# Patient Record
Sex: Male | Born: 1990 | Race: White | Hispanic: No | Marital: Single | Smoking: Former smoker
Health system: Southern US, Community
[De-identification: ages and names within clinical notes are randomized; demographics above are authoritative.]

## PROBLEM LIST (undated history)

## (undated) DIAGNOSIS — F419 Anxiety disorder, unspecified: Secondary | ICD-10-CM

## (undated) HISTORY — DX: Anxiety disorder, unspecified: F41.9

---

## 2020-12-13 ENCOUNTER — Emergency Department (HOSPITAL_COMMUNITY): Payer: 59

## 2020-12-13 ENCOUNTER — Other Ambulatory Visit: Payer: Self-pay

## 2020-12-13 ENCOUNTER — Emergency Department (HOSPITAL_COMMUNITY)
Admission: EM | Admit: 2020-12-13 | Discharge: 2020-12-13 | Disposition: A | Discharge status: Home / Self Care | Payer: 59 | Attending: Emergency Medicine | Admitting: Emergency Medicine

## 2020-12-13 ENCOUNTER — Encounter (HOSPITAL_COMMUNITY): Payer: Self-pay

## 2020-12-13 DIAGNOSIS — R002 Palpitations: Secondary | ICD-10-CM | POA: Diagnosis not present

## 2020-12-13 DIAGNOSIS — R0789 Other chest pain: Secondary | ICD-10-CM | POA: Diagnosis not present

## 2020-12-13 LAB — BASIC METABOLIC PANEL
Anion gap: 9 (ref 5–15)
BUN: 9 mg/dL (ref 6–20)
CO2: 24 mmol/L (ref 22–32)
Calcium: 9.3 mg/dL (ref 8.9–10.3)
Chloride: 106 mmol/L (ref 98–111)
Creatinine, Ser: 0.9 mg/dL (ref 0.61–1.24)
GFR, Estimated: >60 mL/min (ref >60)
Glucose, Bld: 97 mg/dL (ref 70–99)
Potassium: 3.8 mmol/L (ref 3.5–5.1)
Sodium: 139 mmol/L (ref 135–145)

## 2020-12-13 LAB — CBC
HCT: 44.7 % (ref 39.0–52.0)
Hemoglobin: 15.2 g/dL (ref 13.0–17.0)
MCH: 29.6 pg (ref 26.0–34.0)
MCHC: 34 g/dL (ref 30.0–36.0)
MCV: 87 fL (ref 80.0–100.0)
Platelets: 248 10*3/uL (ref 150–400)
RBC: 5.14 MIL/uL (ref 4.22–5.81)
RDW: 12.5 % (ref 11.5–15.5)
WBC: 7.5 10*3/uL (ref 4.0–10.5)
nRBC: 0 % (ref 0.0–0.2)

## 2020-12-13 LAB — TROPONIN I (HIGH SENSITIVITY): Troponin I (High Sensitivity): <2 ng/L (ref <18)

## 2020-12-13 NOTE — ED Triage Notes (Signed)
Patient c/o chest discomfort x 2 weeks, especially when he lies down. Patient also reports that he had a one time experience 2 days ago where his heart was racing. Patient did report that he has at least one energy drink or 2 a day and caffeine sodas. Patient states he has not drank since having rapid heart rate. Patient denies any SOB, diaphoresis , or nausea with chest discomfort.

## 2020-12-13 NOTE — ED Provider Notes (Signed)
[facility]-EMERGENCY DEPT Provider Note   CSN: [id number] Arrival date & time: 12/13/20  1636     History Chief Complaint  Patient presents with  . Chest Pain  . Tachycardia    Howard Butler is a 30 y.o. male.  Howard Butler , a 30 y.o. male  was evaluated in triage.  Pt with no significant past medical history complains of left-sided chest discomfort for the past 2 to 3 weeks which occurs when he is lying supine on his left side, improves with laying on his back or on his right side.  Described as tightness feeling deep in his chest.  Denies chest pain with exertion.  Denies shortness of breath, diaphoresis.  States has been nauseous at times.  Patient states that the past 2 days he has woken up in the middle the night feeling like his heart is racing, lasts for a few minutes and resolves.  Patient states that he does drink a lot of caffeinated beverages throughout the day and has not been drinking much water lately.  Denies drug or alcohol use, is a non-smoker.        History reviewed. No pertinent past medical history.  There are no problems to display for this patient.   History reviewed. No pertinent surgical history.     Family History  Problem Relation Age of Onset  . Diabetes Mother     Social History   Tobacco Use  . Smoking status: Never Smoker  . Smokeless tobacco: Never Used  Vaping Use  . Vaping Use: Never used  Substance Use Topics  . Alcohol use: Never  . Drug use: Not Currently    Types: Marijuana    Home Medications Prior to Admission medications   Not on File    Allergies    Patient has no allergy information on record.  Review of Systems   Review of Systems  Constitutional: Negative for diaphoresis, fatigue and fever.  Respiratory: Negative for shortness of breath.   Cardiovascular: Positive for chest pain and palpitations.  Gastrointestinal: Positive for nausea. Negative for vomiting.   Musculoskeletal: Negative for arthralgias and myalgias.  Skin: Negative for rash.  Allergic/Immunologic: Negative for immunocompromised state.  Neurological: Negative for weakness.  All other systems reviewed and are negative.   Physical Exam Updated Vital Signs BP (!) 150/102 (BP Location: Left Arm)   Pulse 75   Temp 98.2 F (36.8 C) (Oral)   Resp 20   Ht 5\' 11"  (1.803 m)   Wt 93 kg   SpO2 100%   BMI 28.59 kg/m   Physical Exam Vitals and nursing note reviewed.  Constitutional:      General: He is not in acute distress.    Appearance: He is well-developed and well-nourished. He is not diaphoretic.  HENT:     Head: Normocephalic and atraumatic.  Cardiovascular:     Rate and Rhythm: Normal rate and regular rhythm.     Heart sounds: Normal heart sounds. No murmur heard.   Pulmonary:     Effort: Pulmonary effort is normal.     Breath sounds: Normal breath sounds.  Chest:     Chest wall: Tenderness present.       Comments: Mild ttp left chest wall, states this does not reproduce his pain.  Abdominal:     Palpations: Abdomen is soft.     Tenderness: There is no abdominal tenderness.  Musculoskeletal:     Cervical back: Neck supple.  Right lower leg: No edema.     Left lower leg: No edema.  Skin:    General: Skin is warm and dry.     Findings: No erythema or rash.  Neurological:     Mental Status: He is alert and oriented to person, place, and time.  Psychiatric:        Mood and Affect: Mood and affect normal.        Behavior: Behavior normal.     ED Results / Procedures / Treatments   Labs (all labs ordered are listed, but only abnormal results are displayed) Labs Reviewed  BASIC METABOLIC PANEL  CBC  TROPONIN I (HIGH SENSITIVITY)    EKG None  Radiology DG Chest 2 View  Result Date: 12/13/2020 CLINICAL DATA:  Chest pain EXAM: CHEST - 2 VIEW COMPARISON:  None. FINDINGS: The heart size and mediastinal contours are within normal limits. Both lungs  are clear. The visualized skeletal structures are unremarkable. IMPRESSION: Normal study. Electronically Signed   By: Charlett Nose M.D.   On: 12/13/2020 17:09    Procedures Procedures   Medications Ordered in ED Medications - No data to display  ED Course  I have reviewed the triage vital signs and the nursing notes.  Pertinent labs & imaging results that were available during my care of the patient were reviewed by me and considered in my medical decision making (see chart for details).  Clinical Course as of 12/13/20 1937  Howard Butler Dec 13, 2020  [id number] 30 year old male with complaint of chest pain and palpitations as above.  On exam found to have mild left chest wall tenderness which he states does not reproduce his discomfort.  EKG without acute ischemic changes, troponin is less than 2, doubt ACS.  CBC and BMP within normal limits, chest x-ray unremarkable. Discussed results with patient, recommend PCP follow-up, consider ambulatory monitoring.  Advised to return to the emergency room for worsening or concerning symptoms.  Recommend decrease caffeine intake, recommend ibuprofen 3 times daily for chest wall pain. [LM]    Clinical Course User Index [LM] [name]   MDM Rules/Calculators/A&P                         Final Clinical Impression(s) / ED Diagnoses Final diagnoses:  Palpitations  Atypical chest pain    Rx / DC Orders ED Discharge Orders    None       [name] 12/13/20 1937    Terald Sleeper, MD 12/14/20 [phone]

## 2020-12-13 NOTE — ED Triage Notes (Signed)
Emergency Medicine Provider Triage Evaluation Note  Howard Butler , a 30 y.o. male  was evaluated in triage.  Pt with no significant past medical history complains of left-sided chest discomfort for the past 2 to 3 weeks which occurs when he is lying supine on his left side, improves with laying on his back or on his right side.  Described as tightness feeling deep in his chest.  Denies chest pain with exertion.  Denies shortness of breath, diaphoresis.  States has been nauseous at times.  Patient states that the past 2 days he has woken up in the middle the night feeling like his heart is racing, lasts for a few minutes and resolves.  Patient states that he does drink a lot of caffeinated beverages throughout the day and has not been drinking much water lately.  Denies drug or alcohol use, is a non-smoker.  Review of Systems  Positive: Palpitations, chest pain Negative: Lower extremity edema, shortness of breath, diaphoresis  Physical Exam  BP (!) 150/102 (BP Location: Left Arm)   Pulse 75   Temp 98.2 F (36.8 C) (Oral)   Resp 20   Ht 5\' 11"  (1.803 m)   Wt 93 kg   SpO2 100%   BMI 28.59 kg/m  Gen:   Awake, no distress  HEENT:  Atraumatic Resp:  Normal effort, CTA Cardiac:  Normal rate, normal rhythm, no murmur Abd:   Nondistended, nontender  MSK:   Moves extremities without difficulty, no edema Neuro:  Speech clear   Medical Decision Making  Medically screening exam initiated at 5:00 PM.  Appropriate orders placed.  was informed that the remainder of the evaluation will be completed by another provider, this initial triage assessment does not replace that evaluation, and the importance of remaining in the ED until their evaluation is complete.  Clinical Impression     [name], PA-C 12/13/20 1702

## 2020-12-13 NOTE — Discharge Instructions (Addendum)
Limit caffeine intake and see how this affects her symptoms. Try taking 600 mg of ibuprofen 3 times daily with food, discontinue use if you develop abdominal pain. Follow-up with a primary care provider, referral given for [facility] if you do not have a primary care provider. Return to the ER for worsening or concerning symptoms.

## 2021-01-06 ENCOUNTER — Emergency Department (HOSPITAL_COMMUNITY)
Admission: EM | Admit: 2021-01-06 | Discharge: 2021-01-06 | Disposition: A | Discharge status: Home / Self Care | Payer: 59 | Attending: Emergency Medicine | Admitting: Emergency Medicine

## 2021-01-06 ENCOUNTER — Other Ambulatory Visit: Payer: Self-pay

## 2021-01-06 ENCOUNTER — Encounter (HOSPITAL_COMMUNITY): Payer: Self-pay | Admitting: Emergency Medicine

## 2021-01-06 ENCOUNTER — Ambulatory Visit: Payer: Self-pay | Admitting: *Deleted

## 2021-01-06 DIAGNOSIS — R0602 Shortness of breath: Secondary | ICD-10-CM | POA: Diagnosis not present

## 2021-01-06 DIAGNOSIS — R002 Palpitations: Secondary | ICD-10-CM | POA: Insufficient documentation

## 2021-01-06 DIAGNOSIS — M549 Dorsalgia, unspecified: Secondary | ICD-10-CM | POA: Insufficient documentation

## 2021-01-06 DIAGNOSIS — Z5321 Procedure and treatment not carried out due to patient leaving prior to being seen by health care provider: Secondary | ICD-10-CM | POA: Insufficient documentation

## 2021-01-06 NOTE — Telephone Encounter (Signed)
Patient c/o heart palpitations and increased SOB throughout the day. Now feeling labored breathing and lightheadedness. reports he feels he needs to keep moving to keep heart rate in the 90's. Reports when bending over heart rate goes to 170's. When lying down heart rate goes to 40's-60's. Patient report apple watch activity noted with record of irregular heart rates. Patient is scheduled  To see a cardiologist on Thursday, but palpitations are worse today.  Reports taking 1000 mg acetaminophen every 4 to 6 hours for several days. Instructed patient not to take greater than 4000 mg of acetaminophen in a 24 hour period. Patient reports he was told he could take aspirin but was taking acetaminophen . Patient instructed to go to ED now and patient reports he will eat and see if he continues to have labored breathing and irregular heart rate. Care advise given. Patient verbalized understanding of care advise and to go to ED or call 911 if symptoms worsen.   Reason for Disposition . [1] Skipped or extra beat(s) AND [2] increases with exercise or exertion  Answer Assessment - Initial Assessment Questions 1. DESCRIPTION: "Please describe your heart rate or heartbeat that you are having" (e.g., fast/slow, regular/irregular, skipped or extra beats, "palpitations")     Slow heart rate, irregular, skipped beats,  2. ONSET: "When did it start?" (Minutes, hours or days)     Several  Days and today is worst  3. DURATION: "How long does it last" (e.g., seconds, minutes, hours)     Can go from 40's-60's to 90's and 170's if bending over  4. PATTERN "Does it come and go, or has it been constant since it started?"  "Does it get worse with exertion?"   "Are you feeling it now?"     constant 5. TAP: "Using your hand, can you tap out what you are feeling on a chair or table in front of you, so that I can hear?" (Note: not all patients can do this)       na 6. HEART RATE: "Can you tell me your heart rate?" "How many beats  in 15 seconds?"  (Note: not all patients can do this)       86 bpm and goes to 91bpm 7. RECURRENT SYMPTOM: "Have you ever had this before?" If Yes, ask: "When was the last time?" and "What happened that time?"      Yes , a few weeks ago and was sent home from ED 8. CAUSE: "What do you think is causing the palpitations?"     Not sure 9. CARDIAC HISTORY: "Do you have any history of heart disease?" (e.g., heart attack, angina, bypass surgery, angioplasty, arrhythmia)      unknown 10. OTHER SYMPTOMS: "Do you have any other symptoms?" (e.g., dizziness, chest pain, sweating, difficulty breathing)        Labored breathing, lightheadedness 11. PREGNANCY: "Is there any chance you are pregnant?" "When was your last menstrual period?"       na  Protocols used: HEART RATE AND HEARTBEAT QUESTIONS-A-AH

## 2021-01-06 NOTE — ED Triage Notes (Signed)
Patient presents with palpitations, SOB and back muscle tightness. The patient has been previously seen for this about 1 month ago and was referred to a specialist. He has an appointment with a cardiologist on Thursday.  He also states he is concerned about the dramatic fluctuations he has seen in his heart rate. He believes the cause of this may be due to his Covid vaccine.

## 2021-01-06 NOTE — ED Notes (Signed)
Pt stated that his heart rate was between 80 and 90 bpm as checked on his smart watch. He felt better and decided to leave without being seen after triage.

## 2021-01-06 NOTE — ED Notes (Signed)
Called 3x for room placement. Eloped from waiting area.  

## 2021-01-07 ENCOUNTER — Encounter (HOSPITAL_COMMUNITY): Payer: Self-pay | Admitting: *Deleted

## 2021-01-07 ENCOUNTER — Emergency Department (HOSPITAL_COMMUNITY): Payer: 59

## 2021-01-07 ENCOUNTER — Emergency Department (HOSPITAL_COMMUNITY)
Admission: EM | Admit: 2021-01-07 | Discharge: 2021-01-07 | Disposition: A | Discharge status: Home / Self Care | Payer: 59 | Attending: Emergency Medicine | Admitting: Emergency Medicine

## 2021-01-07 DIAGNOSIS — R0602 Shortness of breath: Secondary | ICD-10-CM | POA: Diagnosis not present

## 2021-01-07 DIAGNOSIS — R002 Palpitations: Secondary | ICD-10-CM | POA: Insufficient documentation

## 2021-01-07 DIAGNOSIS — R945 Abnormal results of liver function studies: Secondary | ICD-10-CM | POA: Diagnosis not present

## 2021-01-07 DIAGNOSIS — R0789 Other chest pain: Secondary | ICD-10-CM | POA: Insufficient documentation

## 2021-01-07 DIAGNOSIS — R7989 Other specified abnormal findings of blood chemistry: Secondary | ICD-10-CM

## 2021-01-07 DIAGNOSIS — M545 Low back pain, unspecified: Secondary | ICD-10-CM | POA: Insufficient documentation

## 2021-01-07 LAB — COMPREHENSIVE METABOLIC PANEL
ALT: 103 U/L — ABNORMAL HIGH (ref 0–44)
AST: 53 U/L — ABNORMAL HIGH (ref 15–41)
Albumin: 5 g/dL (ref 3.5–5.0)
Alkaline Phosphatase: 66 U/L (ref 38–126)
Anion gap: 14 (ref 5–15)
BUN: 14 mg/dL (ref 6–20)
CO2: 20 mmol/L — ABNORMAL LOW (ref 22–32)
Calcium: 10.2 mg/dL (ref 8.9–10.3)
Chloride: 104 mmol/L (ref 98–111)
Creatinine, Ser: 0.9 mg/dL (ref 0.61–1.24)
GFR, Estimated: >60 mL/min (ref >60)
Glucose, Bld: 106 mg/dL — ABNORMAL HIGH (ref 70–99)
Potassium: 3.6 mmol/L (ref 3.5–5.1)
Sodium: 138 mmol/L (ref 135–145)
Total Bilirubin: 1.4 mg/dL — ABNORMAL HIGH (ref 0.3–1.2)
Total Protein: 9 g/dL — ABNORMAL HIGH (ref 6.5–8.1)

## 2021-01-07 LAB — CBC WITH DIFFERENTIAL/PLATELET
Abs Immature Granulocytes: 0.01 10*3/uL (ref 0.00–0.07)
Basophils Absolute: 0 10*3/uL (ref 0.0–0.1)
Basophils Relative: 1 %
Eosinophils Absolute: 0 10*3/uL (ref 0.0–0.5)
Eosinophils Relative: 0 %
HCT: 46.2 % (ref 39.0–52.0)
Hemoglobin: 16.6 g/dL (ref 13.0–17.0)
Immature Granulocytes: 0 %
Lymphocytes Relative: 35 %
Lymphs Abs: 1.7 10*3/uL (ref 0.7–4.0)
MCH: 29.5 pg (ref 26.0–34.0)
MCHC: 35.9 g/dL (ref 30.0–36.0)
MCV: 82.1 fL (ref 80.0–100.0)
Monocytes Absolute: 0.6 10*3/uL (ref 0.1–1.0)
Monocytes Relative: 12 %
Neutro Abs: 2.6 10*3/uL (ref 1.7–7.7)
Neutrophils Relative %: 52 %
Platelets: 232 10*3/uL (ref 150–400)
RBC: 5.63 MIL/uL (ref 4.22–5.81)
RDW: 12.2 % (ref 11.5–15.5)
WBC: 5 10*3/uL (ref 4.0–10.5)
nRBC: 0 % (ref 0.0–0.2)

## 2021-01-07 LAB — TSH: TSH: 1.871 u[IU]/mL (ref 0.350–4.500)

## 2021-01-07 LAB — TROPONIN I (HIGH SENSITIVITY): Troponin I (High Sensitivity): 3 ng/L (ref <18)

## 2021-01-07 LAB — D-DIMER, QUANTITATIVE: D-Dimer, Quant: 0.43 ug/mL-FEU (ref 0.00–0.50)

## 2021-01-07 NOTE — Discharge Instructions (Signed)
As discussed, all of your labs are reassuring today.  Your cardiac marker was normal.  Your chest x-ray and EKG were also normal.  Report to your cardiology appointment on Thursday for further evaluation.  I recommend refraining from Tylenol use because your liver enzymes were elevated today. Have labs rechecked in a week to ensure liver enzymes return to normal. Return to the ER for new or worsening symptoms.

## 2021-01-07 NOTE — ED Provider Notes (Signed)
[facility]-EMERGENCY DEPT Provider Note   CSN: [id number] Arrival date & time: 01/07/21  0825     History Chief Complaint  Patient presents with  . Tachycardia    Howard Butler is a 30 y.o. male with no significant past medical history who presents to the ED due to palpitations associated with shortness of breath and chest/back pain.  Patient describes pain as a discomfort which he relates to a "pulled muscle".  Discomfort started mid December into January which started on the left side of his chest.  Patient states pain was worse when sleeping on his left side.  He attributed the pain to a recent move to the area from [place] after lifting heavy boxes, patient notes discomfort began to move to his upper back.  He has been taking 3 g of Tylenol daily with moderate relief.  Patient also admits to intermittent episodes of palpitations associated with exertion.  Patient states when he walks short distances his heart rate will jump to the 120s to 130s per his apple watch and occasionally to 170s.  Patient notes his heart rate normalizes after roughly 1 minute of rest.  Palpitations associated with shortness of breath.  Denies central chest pressure with episodes of palpitations.  Patient notes he spoke to a doctor a few weeks ago who had concerns about possible myocarditis following the Moderna vaccine.  Patient denies recent illness.  Denies fever and chills.  Denies lower extremity edema.  Denies history of blood clots, recent surgeries or recent long immobilizations, and hormonal treatments.  Patient's longest travel was roughly 9 to 10 hours driving from [place] to [place] over a month ago which he notes he had frequent stops.  Chart reviewed.  Patient was evaluated in the ED on 12/13/2020 for the same complaint with normal troponin and reassuring work-up.  Patient has a cardiology appointment on Thursday for further evaluation of palpitations.  Patient notes he was  drinking roughly 1-2 energy drinks prior to his first evaluation in the ED on the 28th however, has stopped drinking caffeine with an occasional Sprite.  Denies amphetamines and cocaine use.  Denies any cardiac history.  No family history of early CAD.  Denies tobacco use. Denies alcohol use.   History obtained from patient and past medical records. No interpreter used during encounter.      History reviewed. No pertinent past medical history.  There are no problems to display for this patient.   History reviewed. No pertinent surgical history.     Family History  Problem Relation Age of Onset  . Diabetes Mother     Social History   Tobacco Use  . Smoking status: Never Smoker  . Smokeless tobacco: Never Used  Vaping Use  . Vaping Use: Never used  Substance Use Topics  . Alcohol use: Never  . Drug use: Not Currently    Types: Marijuana    Home Medications Prior to Admission medications   Not on File    Allergies    Patient has no known allergies.  Review of Systems   Review of Systems  Constitutional: Negative for chills and fever.  Respiratory: Positive for shortness of breath. Negative for cough.   Cardiovascular: Positive for chest pain and palpitations. Negative for leg swelling.  Gastrointestinal: Negative for abdominal pain.  All other systems reviewed and are negative.   Physical Exam Updated Vital Signs BP 121/81   Pulse 82   Temp 98 F (36.7 C) (Oral)   Resp  17   Ht 5\' 11"  (1.803 m)   Wt 93 kg   SpO2 99%   BMI 28.59 kg/m   Physical Exam Vitals and nursing note reviewed.  Constitutional:      General: He is not in acute distress.    Appearance: He is not toxic-appearing.  HENT:     Head: Normocephalic.  Eyes:     Pupils: Pupils are equal, round, and reactive to light.  Cardiovascular:     Rate and Rhythm: Normal rate and regular rhythm.     Pulses: Normal pulses.     Heart sounds: Normal heart sounds. No murmur heard. No friction  rub. No gallop.   Pulmonary:     Effort: Pulmonary effort is normal.     Breath sounds: Normal breath sounds.  Chest:     Comments: Tenderness palpation left-sided anterior chest wall.  No crepitus or deformity. Abdominal:     General: Abdomen is flat. Bowel sounds are normal. There is no distension.     Palpations: Abdomen is soft.     Tenderness: There is no abdominal tenderness. There is no guarding or rebound.  Musculoskeletal:     Cervical back: Neck supple.     Comments: No lower extremity edema.  Negative Homans' sign bilaterally.  Skin:    General: Skin is warm and dry.  Neurological:     General: No focal deficit present.     Mental Status: He is alert.  Psychiatric:        Mood and Affect: Mood is anxious.        Behavior: Behavior normal.     ED Results / Procedures / Treatments   Labs (all labs ordered are listed, but only abnormal results are displayed) Labs Reviewed  COMPREHENSIVE METABOLIC PANEL - Abnormal; Notable for the following components:      Result Value   CO2 20 (*)    Glucose, Bld 106 (*)    Total Protein 9.0 (*)    AST 53 (*)    ALT 103 (*)    Total Bilirubin 1.4 (*)    All other components within normal limits  CBC WITH DIFFERENTIAL/PLATELET  D-DIMER, QUANTITATIVE  TSH  TROPONIN I (HIGH SENSITIVITY)    EKG None  Radiology DG Chest Portable 1 View  Result Date: 01/07/2021 CLINICAL DATA:  Chest pain and shortness of breath EXAM: PORTABLE CHEST 1 VIEW COMPARISON:  December 13, 2020. FINDINGS: Lungs are clear. Heart size and pulmonary vascularity are normal. No adenopathy. No pneumothorax. No bone lesions. IMPRESSION: Lungs clear.  Cardiac silhouette normal. Electronically Signed   By: December 15, 2020 III M.D.   On: 01/07/2021 10:51    Procedures Procedures   Medications Ordered in ED Medications - No data to display  ED Course  I have reviewed the triage vital signs and the nursing notes.  Pertinent labs & imaging results that were  available during my care of the patient were reviewed by me and considered in my medical decision making (see chart for details).    MDM Rules/Calculators/A&P                         30 year old male presents to the ED due to palpitations associated with left-sided chest and back discomfort that has been ongoing for the past 1 to 2 months.  Patient has history of blood clots, recent surgeries, recent long immobilizations, hormonal treatment.  Patient concerned about possible myocarditis following the Moderna vaccine. Denies fever  and chills.  He has a cardiology appointment on Thursday for further evaluation of palpitations.  Upon arrival, patient afebrile, tachycardic at 110 with mild tachypnea at 21.  Patient maintaining O2 saturation above 95% on room air.  During initial evaluation, patient appears anxious at bedside.  No clinical signs of DVT.  Reproducible left-sided anterior chest wall tenderness.  No crepitus or deformity.  EKG, chest x-ray, and troponin ordered to rule out cardiac etiology.  D-dimer to rule out PE given history of travel from [place] with tachycardia.  Routine labs.  Suspect tachycardia could partially be related to underlying anxiety.  Patient denies alcohol use.  Low suspicion for alcohol withdrawal.  CBC unremarkable no leukocytosis and normal hemoglobin.  TSH normal. D-dimer normal at 0.43.  Doubt PE/DVT.  Troponin normal at 3.  Low suspicion for ACS.  Dissection was considered but thought to be less likely given presentation and longevity of symptoms.  CMP significant for elevated LFTs with AST at 53 and ALT at 103.  Will have patient follow-up with PCP to repeat LFTs to ensure downtrending.  Chest x-ray personally reviewed which is negative for signs of pneumonia, pneumothorax, widened mediastinum.  EKG personally reviewed which demonstrates normal sinus rhythm with early repolarization. Encouraged patient to limit tylenol use due to LFTs.  Advised patient report to his  cardiology appointment on Thursday for further evaluation of palpitations. Strict ED precautions discussed with patient. Patient states understanding and agrees to plan. Patient discharged home in no acute distress and stable vitals.  Final Clinical Impression(s) / ED Diagnoses Final diagnoses:  Palpitations  Elevated LFTs    Rx / DC Orders ED Discharge Orders    None       Jesusita Oka 01/07/21 1207    Jacalyn Lefevre, MD 01/07/21 1446

## 2021-01-07 NOTE — ED Triage Notes (Signed)
Pt reports rapid HR over the last month. States if he moves it goes over a hundred. Pt 106 in triage while talking nonstop.

## 2021-01-09 ENCOUNTER — Ambulatory Visit: Payer: 59 | Admitting: Cardiology

## 2021-01-09 ENCOUNTER — Other Ambulatory Visit: Payer: Self-pay

## 2021-01-09 ENCOUNTER — Encounter: Payer: Self-pay | Admitting: Cardiology

## 2021-01-09 ENCOUNTER — Inpatient Hospital Stay: Payer: 59

## 2021-01-09 VITALS — BP 144/99 | HR 86 | Temp 97.8°F | Resp 18 | Ht 72.0 in | Wt 200.0 lb

## 2021-01-09 DIAGNOSIS — R002 Palpitations: Secondary | ICD-10-CM

## 2021-01-09 DIAGNOSIS — I498 Other specified cardiac arrhythmias: Secondary | ICD-10-CM

## 2021-01-09 DIAGNOSIS — G90A Postural orthostatic tachycardia syndrome (POTS): Secondary | ICD-10-CM

## 2021-01-09 DIAGNOSIS — I4510 Unspecified right bundle-branch block: Secondary | ICD-10-CM

## 2021-01-09 NOTE — Progress Notes (Unsigned)
    Patient referred by No ref. provider found for palpitations  Subjective:   Howard Butler, male    DOB: 1991/06/08, 30 y.o.   MRN: [id number]   Chief Complaint  Patient presents with  . Palpitations  . New Patient (Initial Visit)     HPI  30 y.o. Caucasian male with tachycardia  Patient moved from [place] in 2021, works as a Neurosurgeon. Since 09/2020, he has experienced tachycardia with minimal physical activity. He denies any particular chest pain or shortness of breath. Heart rate increased on standing up. He was been seen in ER twice, but work has been essentially unremarkable.   He was previously a light smoker, occasionally used ecstasy, recently reduced marijuana use.    Past Medical History:  Diagnosis Date  . Anxiety      History reviewed. No pertinent surgical history.   Social History   Tobacco Use  Smoking Status Former Smoker  . Packs/day: 0.25  . Years: 2.00  . Pack years: 0.50  . Types: Cigarettes  . Quit date: 2021  . Years since quitting: 1.1  Smokeless Tobacco Never Used    Social History   Substance and Sexual Activity  Alcohol Use Never     Family History  Problem Relation Age of Onset  . Diabetes Mother      No current outpatient medications on file prior to visit.   No current facility-administered medications on file prior to visit.    Cardiovascular and other pertinent studies:  EKG 01/09/2021: Sinus rhythm 87 bpm  Incomplete right bundle branch block   Recent labs: 01/07/2021: Glucose 106, BUN/Cr 14/0.9. EGFR >60. Na/K 138/3.6. AST/ALT 53/103. Total protein 9. T.bili 1.4.  Rest of the CMP normal H/H 16/46. MCV 82. Platelets 232 TSH 1.8 normal    Review of Systems  Cardiovascular: Positive for palpitations. Negative for chest pain, dyspnea on exertion, leg swelling and syncope.         Vitals:   01/09/21 0933 01/09/21 0940  BP: (!) 146/98 (!) 144/99  Pulse: 91 86  Resp: 18   Temp: 97.8  F (36.6 C)   SpO2: 96%      Body mass index is 27.12 kg/m. Filed Weights   01/09/21 0933  Weight: 200 lb (90.7 kg)     Objective:   Physical Exam Vitals and nursing note reviewed.  Constitutional:      General: He is not in acute distress. Neck:     Vascular: No JVD.  Cardiovascular:     Rate and Rhythm: Normal rate and regular rhythm.     Heart sounds: Normal heart sounds. No murmur heard.   Pulmonary:     Effort: Pulmonary effort is normal.     Breath sounds: Normal breath sounds. No wheezing or rales.          Assessment & Recommendations:   30 y.o. Caucasian male with tachycardia  Tachycardia: Symptoms and orthostatic vitals s/p POTS Encouraged liberal hydration. Consider compression stockings.  Will check serum and urine metanephrines to exclude secondary cause.  Incomplete RBBB: Will check echcoardiogram     Thank you for referring the patient to [place]. Please feel free to contact with any questions.   Nigel Mormon, MD Pager: [phone] Office: [phone]

## 2021-01-10 ENCOUNTER — Encounter: Payer: Self-pay | Admitting: Cardiology

## 2021-01-10 DIAGNOSIS — R002 Palpitations: Secondary | ICD-10-CM | POA: Insufficient documentation

## 2021-01-10 DIAGNOSIS — G90A Postural orthostatic tachycardia syndrome (POTS): Secondary | ICD-10-CM | POA: Insufficient documentation

## 2021-01-10 DIAGNOSIS — I498 Other specified cardiac arrhythmias: Secondary | ICD-10-CM | POA: Insufficient documentation

## 2021-01-10 DIAGNOSIS — I4510 Unspecified right bundle-branch block: Secondary | ICD-10-CM | POA: Insufficient documentation

## 2021-01-16 ENCOUNTER — Other Ambulatory Visit: Payer: 59

## 2021-01-28 ENCOUNTER — Other Ambulatory Visit: Payer: Self-pay

## 2021-01-28 ENCOUNTER — Ambulatory Visit: Payer: 59

## 2021-01-28 DIAGNOSIS — I4510 Unspecified right bundle-branch block: Secondary | ICD-10-CM

## 2021-01-28 DIAGNOSIS — I498 Other specified cardiac arrhythmias: Secondary | ICD-10-CM

## 2021-01-28 DIAGNOSIS — R002 Palpitations: Secondary | ICD-10-CM

## 2021-01-28 DIAGNOSIS — G90A Postural orthostatic tachycardia syndrome (POTS): Secondary | ICD-10-CM

## 2021-02-10 ENCOUNTER — Other Ambulatory Visit: Payer: Self-pay

## 2021-02-10 ENCOUNTER — Ambulatory Visit: Payer: 59 | Admitting: Cardiology

## 2021-02-10 ENCOUNTER — Encounter: Payer: Self-pay | Admitting: Cardiology

## 2021-02-10 VITALS — BP 128/89 | HR 95 | Temp 97.8°F | Resp 17 | Ht 72.0 in | Wt 206.0 lb

## 2021-02-10 DIAGNOSIS — G90A Postural orthostatic tachycardia syndrome (POTS): Secondary | ICD-10-CM

## 2021-02-10 DIAGNOSIS — I498 Other specified cardiac arrhythmias: Secondary | ICD-10-CM

## 2021-02-10 DIAGNOSIS — R000 Tachycardia, unspecified: Secondary | ICD-10-CM | POA: Insufficient documentation

## 2021-02-10 NOTE — Progress Notes (Signed)
Patient referred by No ref. provider found for palpitations  Subjective:   Howard Butler, male    DOB: Mar 20, 1991, 30 y.o.   MRN: [id number]   Chief Complaint  Patient presents with  . Palpitations  . Follow-up    4 week  . Results    Echo and monitor  . pots     HPI  30 y.o. Caucasian male with POTS  Patient is feeling better than his initial visit.  He has been using compression stockings diligently.  He has also been eating small meals.  Heart racing sensation and lightheadedness on standing up and walking has improved.  Today, he endorses having had a large meal with a sandwich.  Blood pressure elevated today.  Initial consultation HPI 12/2020: Patient moved from [place] in 2021, works as a Neurosurgeon. Since 09/2020, he has experienced tachycardia with minimal physical activity. He denies any particular chest pain or shortness of breath. Heart rate increased on standing up. He was been seen in ER twice, but work has been essentially unremarkable.   He was previously a light smoker, occasionally used ecstasy, recently reduced marijuana use.     Cardiovascular and other pertinent studies:  Echocardiogram 01/28/2021:  Left ventricle cavity is normal in size and wall thickness. Normal global  wall motion. Normal LV systolic function with EF 66%. Normal diastolic  filling pattern.  No significant valvular abnormality.  Normal right atrial pressure.   Mobile cardiac telemetry 6 days 01/09/2021 - 01/16/2021: Dominant rhythm: Sinus. HR 38-151 bpm. Avg HR 70 bpm. <1% isolated SVE, no couplet/triplets. <1% isolated VE, no couplet/triplets. No atrial fibrillation/atrial flutter/SVT/VT/high grade AV block, sinus pause >3sec noted. 1 patient triggered events, correlated with sinus rhythm   EKG 01/09/2021: Sinus rhythm 87 bpm  Incomplete right bundle branch block   Recent labs: 01/07/2021: Glucose 106, BUN/Cr 14/0.9. EGFR >60. Na/K 138/3.6. AST/ALT 53/103.  Total protein 9. T.bili 1.4.  Rest of the CMP normal H/H 16/46. MCV 82. Platelets 232 TSH 1.8 normal    Review of Systems  Cardiovascular: Positive for palpitations. Negative for chest pain, dyspnea on exertion, leg swelling and syncope.         Vitals:   02/10/21 1319 02/10/21 1321  BP: (!) 196/96 128/89  Pulse: (!) 104 95  Resp: 17   Temp: 97.8 F (36.6 C)   SpO2: 97%     Orthostatic VS for the past 72 hrs (Last 3 readings):  Orthostatic BP Patient Position BP Location Cuff Size Orthostatic Pulse  02/10/21 1332 (!) 152/100 Standing Left Arm Normal 121  02/10/21 1331 132/88 Sitting Left Arm Normal 99  02/10/21 1330 135/89 Supine Left Arm Normal 86      Body mass index is 27.94 kg/m. Filed Weights   02/10/21 1319  Weight: 206 lb (93.4 kg)     Objective:   Physical Exam Vitals and nursing note reviewed.  Constitutional:      General: He is not in acute distress. Neck:     Vascular: No JVD.  Cardiovascular:     Rate and Rhythm: Normal rate and regular rhythm.     Heart sounds: Normal heart sounds. No murmur heard.   Pulmonary:     Effort: Pulmonary effort is normal.     Breath sounds: Normal breath sounds. No wheezing or rales.          Assessment & Recommendations:   30 y.o. Caucasian male with POTS  POTS: He still has increasing heart rate on  standing up, but symptomatically is better.  Continue wearing compression stockings, eating small meals.  Contrary to to initial recommendation, I have not asked him to reduce salt intake as his blood pressure is elevated.  I will obtain exercise 7 stress test to assess his heart rate response to exercise.  Recommend follow-up with PCP at some point for elevated liver function enzymes.  F/u in 6 months.    Nigel Mormon, MD Pager: [phone] Office: [phone]

## 2021-08-13 ENCOUNTER — Ambulatory Visit: Payer: 59 | Admitting: Cardiology

## 2022-03-09 IMAGING — CR DG CHEST 2V
2 series · 2 of 2 positions shown · non-contrast
Comparison: None.

CLINICAL DATA: Chest pain

EXAM:
CHEST - 2 VIEW

[w chest pa]
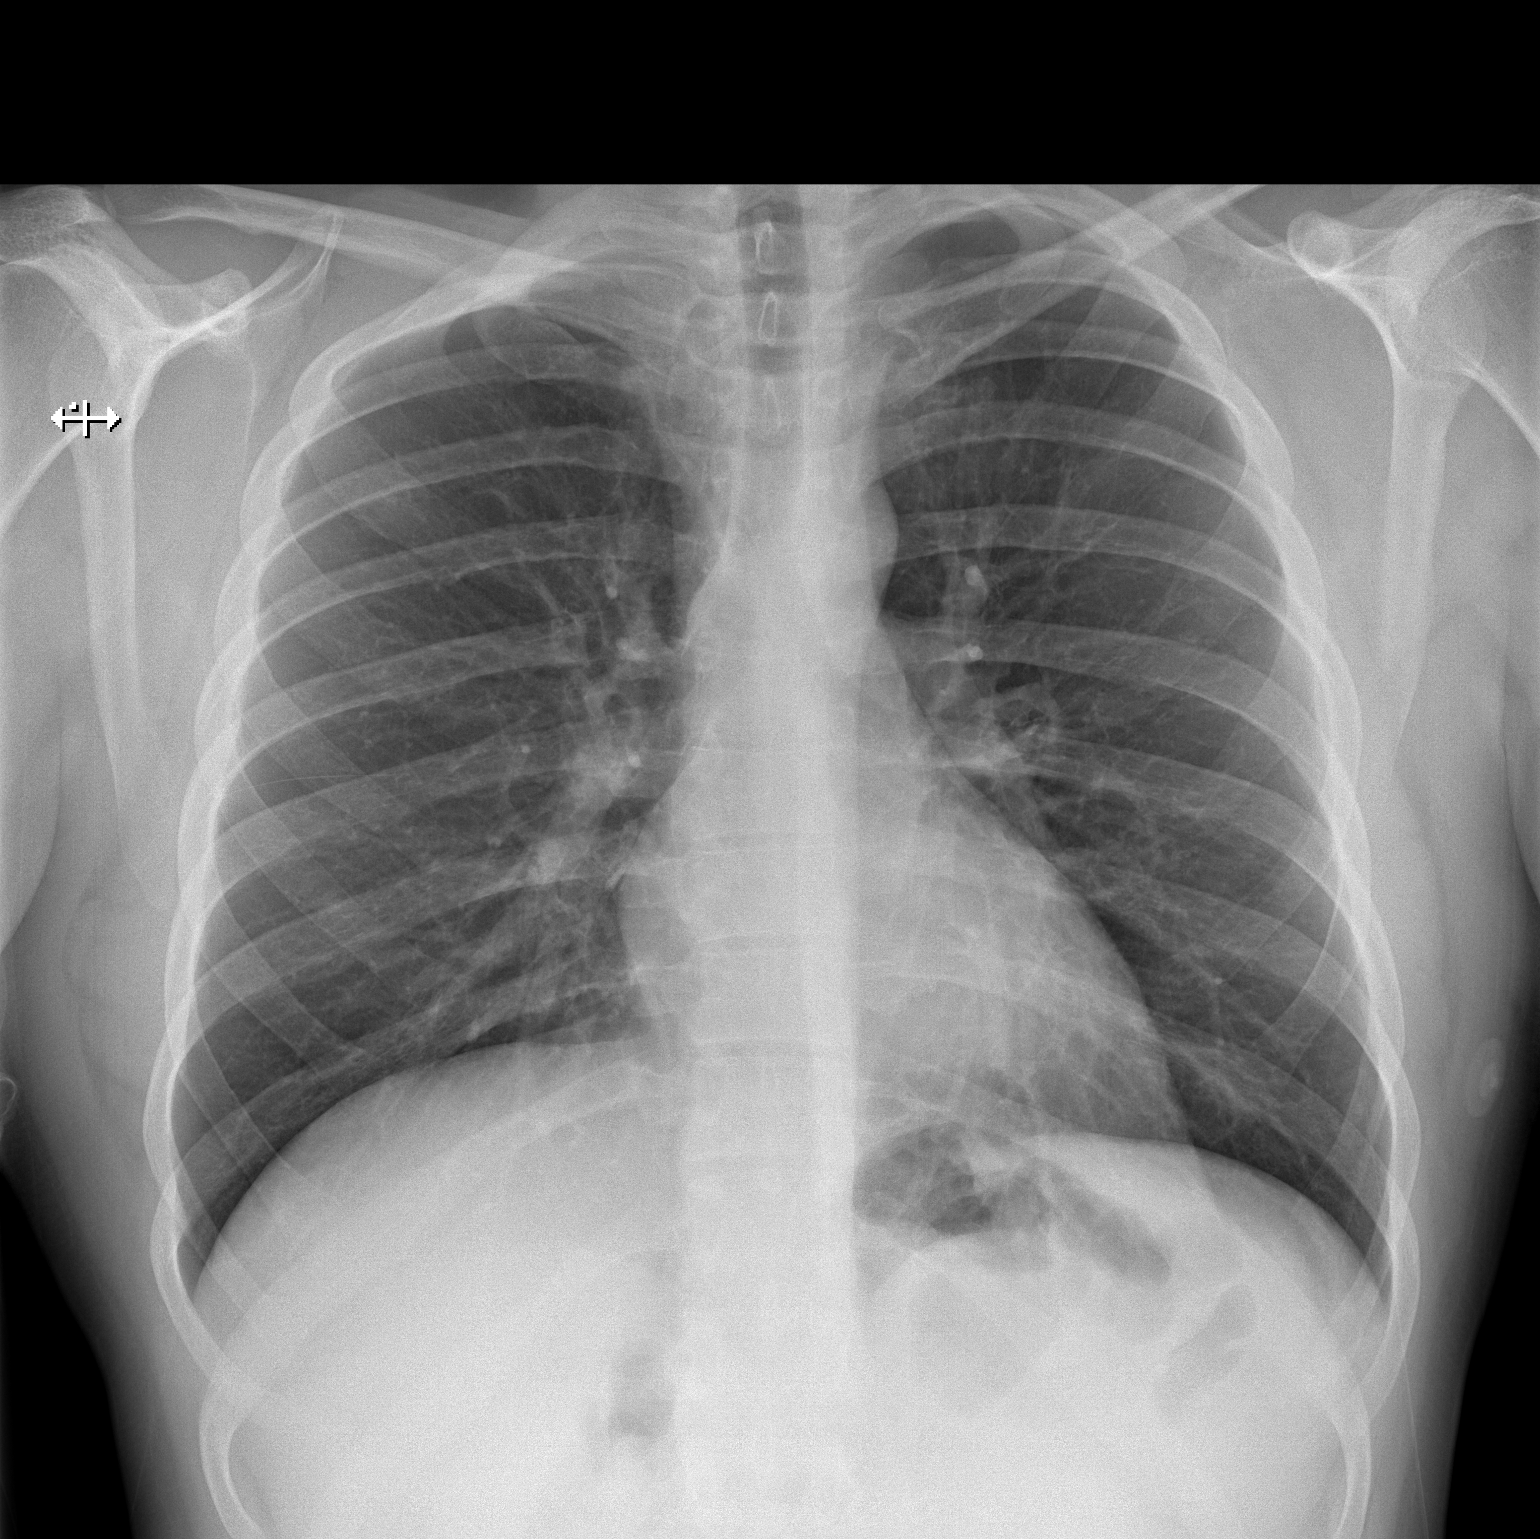

[w chest lat]
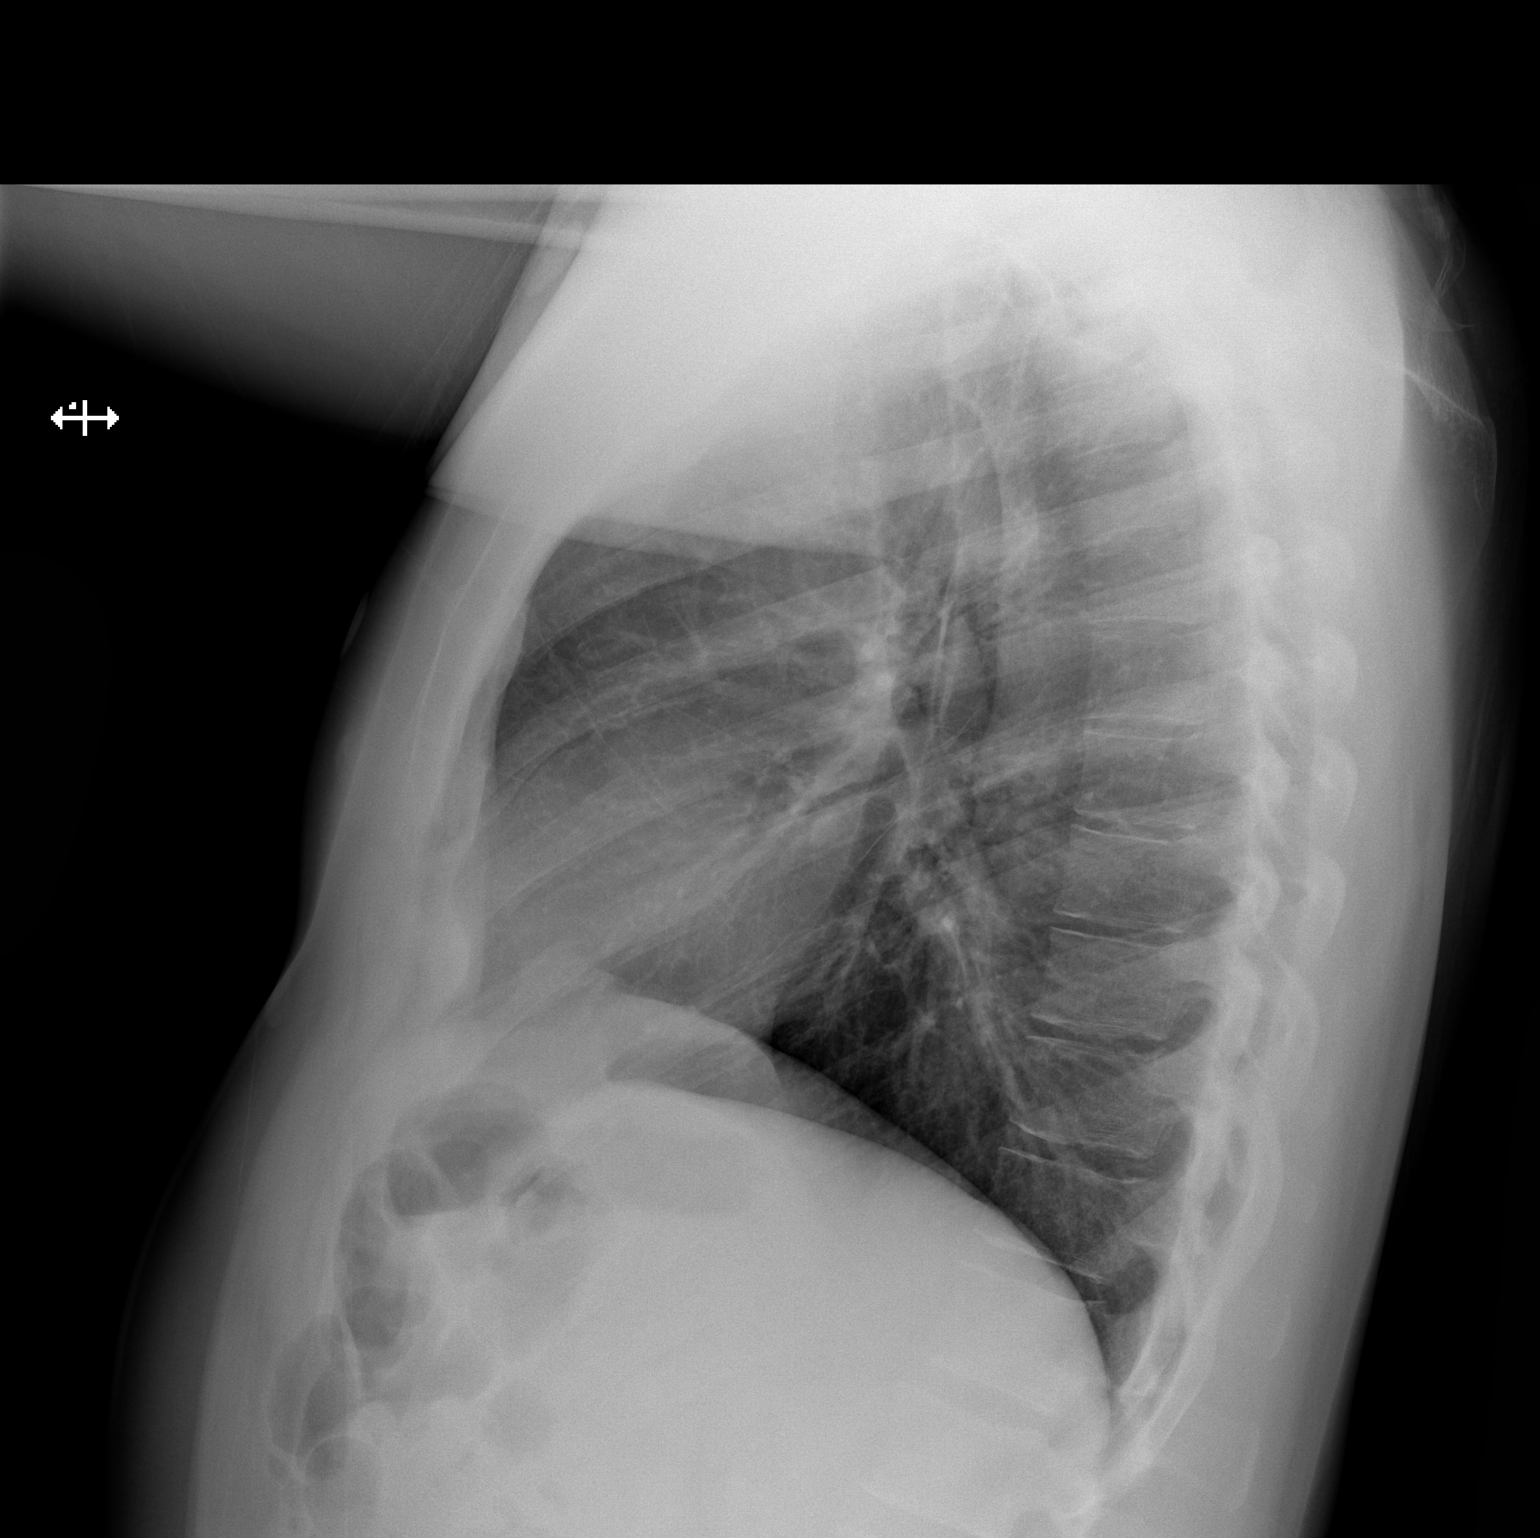

[2 of 2 positions shown; findings below may reference images not displayed]

FINDINGS: The heart size and mediastinal contours are within normal limits.
Both lungs are clear. The visualized skeletal structures are
unremarkable.
IMPRESSION: Normal study.

## 2022-04-03 IMAGING — DX DG CHEST 1V PORT
1 series · 2 of 2 positions shown · non-contrast
Comparison: December 13, 2020.

CLINICAL DATA: Chest pain and shortness of breath

EXAM:
PORTABLE CHEST 1 VIEW

[Series 1: chest ap · 0.14mm/px · 2 of 2 slices shown]
[im 1/2]
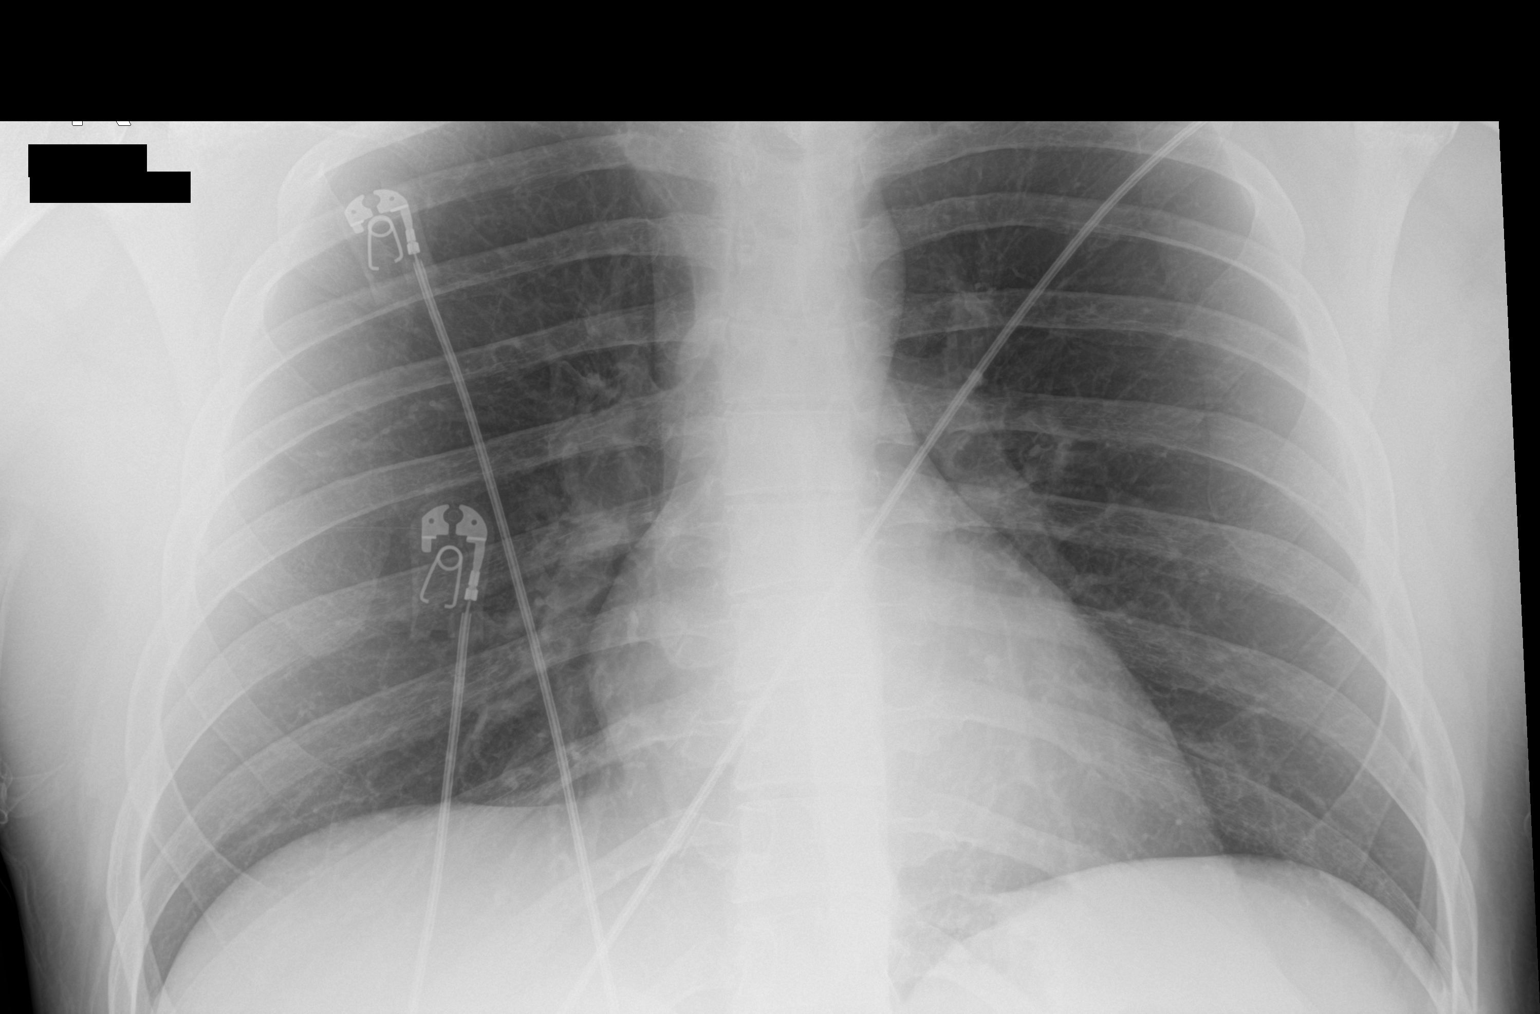
[im 2/2]
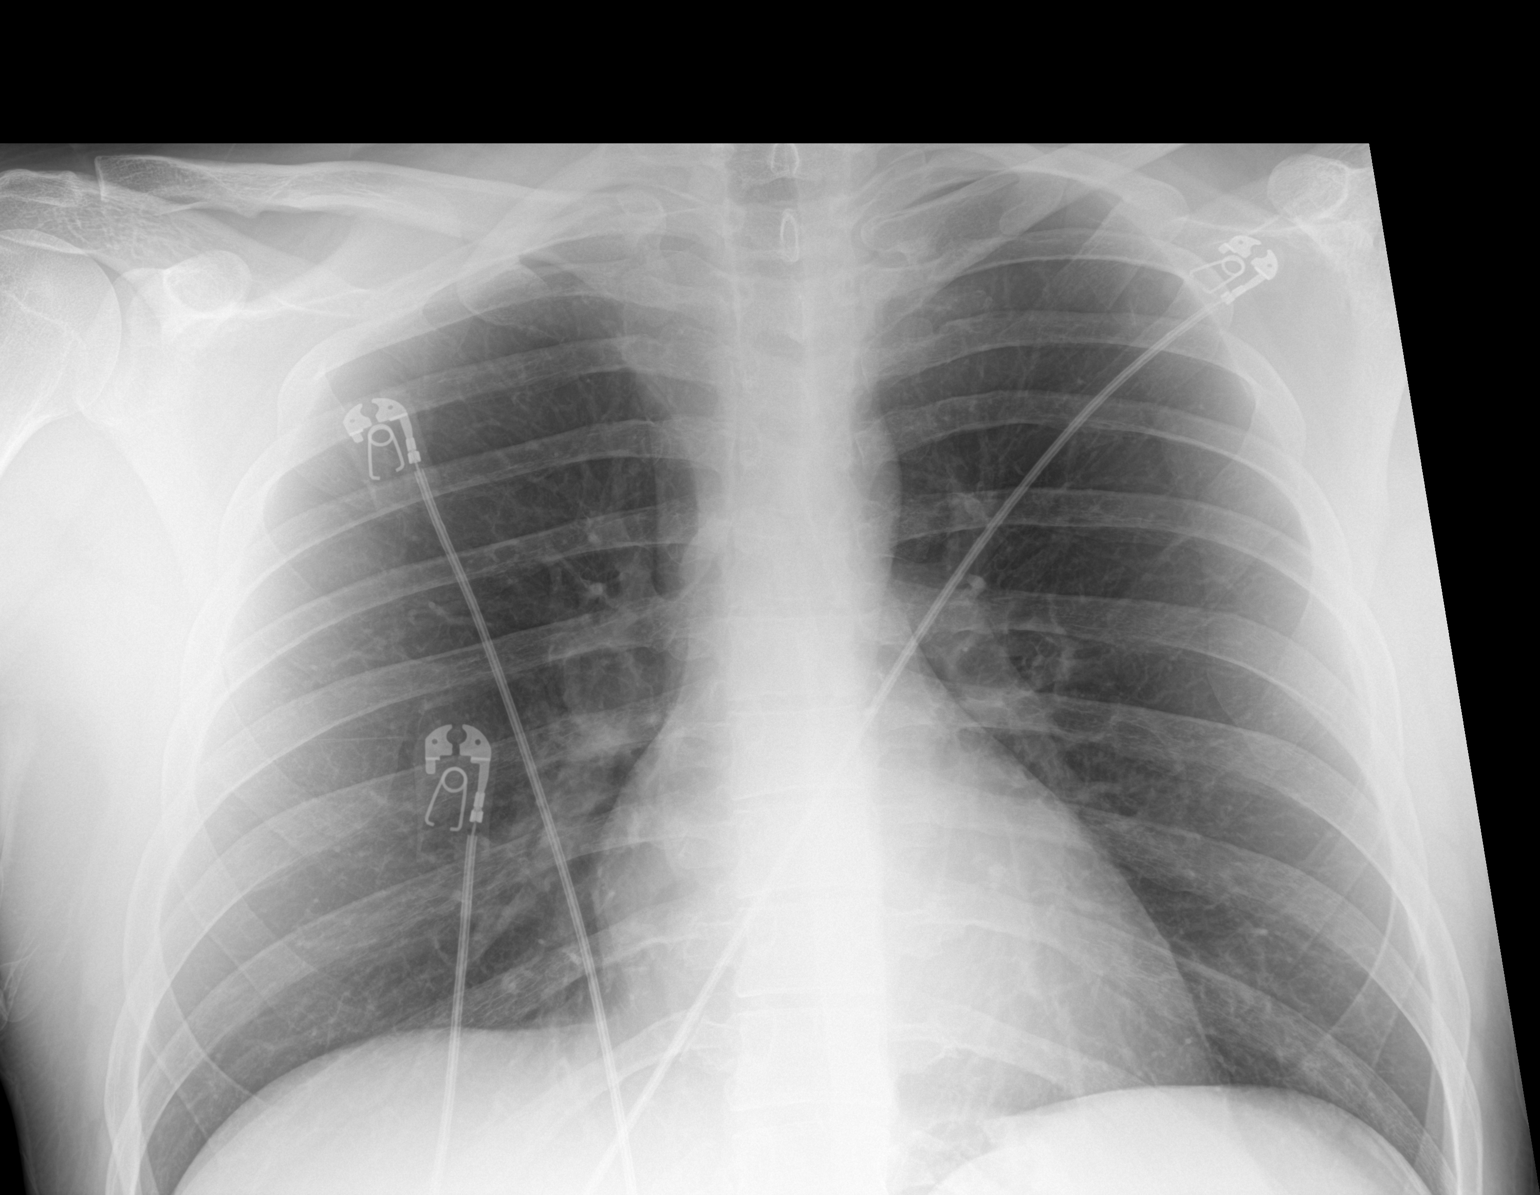

[2 of 2 positions shown; findings below may reference images not displayed]

FINDINGS: Lungs are clear. Heart size and pulmonary vascularity are normal. No
adenopathy. No pneumothorax. No bone lesions.
IMPRESSION: Lungs clear.  Cardiac silhouette normal.
